# Patient Record
Sex: Male | Born: 2000 | Race: Asian | Hispanic: No | Marital: Single
Health system: Southern US, Community
[De-identification: ages and names within clinical notes are randomized; demographics above are authoritative.]

## PROBLEM LIST (undated history)

## (undated) DIAGNOSIS — C9591 Leukemia, unspecified, in remission: Secondary | ICD-10-CM

---
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
## 2019-07-28 ENCOUNTER — Other Ambulatory Visit: Payer: Self-pay

## 2019-07-28 DIAGNOSIS — Z20822 Contact with and (suspected) exposure to covid-19: Secondary | ICD-10-CM

## 2019-07-30 LAB — NOVEL CORONAVIRUS, NAA: SARS-CoV-2, NAA: NOT DETECTED

## 2019-07-31 ENCOUNTER — Other Ambulatory Visit: Payer: Self-pay

## 2019-07-31 DIAGNOSIS — Z20822 Contact with and (suspected) exposure to covid-19: Secondary | ICD-10-CM

## 2019-08-02 LAB — NOVEL CORONAVIRUS, NAA: SARS-CoV-2, NAA: DETECTED — AB

## 2019-08-14 ENCOUNTER — Other Ambulatory Visit: Payer: Self-pay

## 2019-08-14 DIAGNOSIS — Z20822 Contact with and (suspected) exposure to covid-19: Secondary | ICD-10-CM

## 2019-08-16 LAB — NOVEL CORONAVIRUS, NAA: SARS-CoV-2, NAA: NOT DETECTED

## 2022-12-10 ENCOUNTER — Telehealth: Payer: Self-pay

## 2022-12-10 NOTE — Telephone Encounter (Signed)
Mychart msg sent. AS, CMA
